# Patient Record
Sex: Male | Born: 1975 | Race: White | Hispanic: No | Marital: Single | Smoking: Never smoker
Health system: Southern US, Community
[De-identification: ages and names within clinical notes are randomized; demographics above are authoritative.]

## PROBLEM LIST (undated history)

## (undated) DIAGNOSIS — T7840XA Allergy, unspecified, initial encounter: Secondary | ICD-10-CM

## (undated) HISTORY — DX: Allergy, unspecified, initial encounter: T78.40XA

## (undated) HISTORY — PX: EYE SURGERY: SHX253

---

## 2012-04-19 ENCOUNTER — Ambulatory Visit: Payer: 59

## 2012-04-19 ENCOUNTER — Ambulatory Visit: Payer: 59 | Admitting: Family Medicine

## 2012-04-19 VITALS — BP 137/80 | HR 88 | Temp 98.2°F | Resp 18 | Ht 68.5 in | Wt 188.0 lb

## 2012-04-19 DIAGNOSIS — M25476 Effusion, unspecified foot: Secondary | ICD-10-CM

## 2012-04-19 DIAGNOSIS — M25572 Pain in left ankle and joints of left foot: Secondary | ICD-10-CM

## 2012-04-19 DIAGNOSIS — M25473 Effusion, unspecified ankle: Secondary | ICD-10-CM

## 2012-04-19 DIAGNOSIS — M25579 Pain in unspecified ankle and joints of unspecified foot: Secondary | ICD-10-CM

## 2012-04-19 MED ORDER — IBUPROFEN 800 MG PO TABS: 800.0000 mg | ORAL_TABLET | Freq: Three times a day (TID) | ORAL | Status: DC | PRN

## 2012-04-19 MED ORDER — TRAMADOL HCL 50 MG PO TABS: 50.0000 mg | ORAL_TABLET | Freq: Three times a day (TID) | ORAL | Status: DC | PRN

## 2012-04-19 NOTE — Progress Notes (Signed)
[facility]:  Office Visit  Chief Complaint:  Chief Complaint  Patient presents with  . Ankle Pain    since Monday night-slight swelling    HPI: Peter Love is a 36 y.o. male who complains of  Acute left ankle pain x 3 days, swelling, 4/10 sharp constant pain, 9/10 worse pain with weight bearing. Tried ibuprofen and ice without relief. NKI. No h/o gout. Has had a ankle fracture but does not know which one. No fevers, chills. No recent illnesses, no new footwear. He is a runner and over Thanksgiving instead of 3-4 miles he did 5 miles of running on treadmill. Pain is mostly in back of ankle at achilles tendon insertion and on inside area of ankle joint., + pain, swelling, slight warmth.  Past Medical History  Diagnosis Date  . Allergy    Past Surgical History  Procedure Date  . Eye surgery    History   Social History  . Marital Status: Single    Spouse Name: N/A    Number of Children: N/A  . Years of Education: N/A   Social History Main Topics  . Smoking status: Never Smoker   . Smokeless tobacco: None  . Alcohol Use: Yes  . Drug Use: No  . Sexually Active: Yes   Other Topics Concern  . None   Social History Narrative  . None   History reviewed. No pertinent family history. No Known Allergies Prior to Admission medications   Not on File     ROS: The patient denies fevers, chills, night sweats, unintentional weight loss, chest pain, palpitations, wheezing, dyspnea on exertion, nausea, vomiting, abdominal pain, dysuria, hematuria, melena, numbness, weakness, or tingling.   All other systems have been reviewed and were otherwise negative with the exception of those mentioned in the HPI and as above.    PHYSICAL EXAM: Filed Vitals:   04/19/12 0833  BP: 137/80  Pulse: 88  Temp: 98.2 F (36.8 C)  Resp: 18   Filed Vitals:   04/19/12 0833  Height: 5' 8.5" (1.74 m)  Weight: 188 lb (85.276 kg)   Body mass index is 28.17  kg/(m^2).  General: Alert, no acute distress HEENT:  Normocephalic, atraumatic, oropharynx patent.  Skin: No rashes. Cardiac-no pitting edema Neurologic: Facial musculature symmetric. Psychiatric: Patient is appropriate throughout our interaction. Musculoskeletal: Gait antalgic.  Left gastroc-nl Left achilles at area of insertion + tender on squeeze test, neg Thomas test Left ankle and foot- + medial malleolus swelling, + tenderness+ pain with dorsi and planta flexion, pain with dorsi flexion, flat foot, pain at achilles tendon, 5/5 strength, sensation intact, decrease inversion ROM due to pain   LABS: No results found for this or any previous visit.   EKG/XRAY:   Primary read interpreted by Dr. Conley Rolls at [facility]. ? stress fracture on talus vs shadow, normal lucency ? Hypodense sclerotic area on medial aspect of ankle    ASSESSMENT/PLAN: Encounter Diagnoses  Name Primary?  Marland Kitchen Ankle pain, left Yes  . 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 Stress fracture vs achilles tendonitis vs reactive arthritisvs  unlikely achilles tendon or gastrocnemius tear There is some sclerotic bony areas on the patient's medial malleolus and a normal variant ossicle off talus which mayor may not be of any significance.  Rx Cam walker Rx Ibuprofen and Tramadol F/u in 2 weeks.  If worsening sxs return sooner. Patient given precautions.    Hamilton Capri PHUONG, DO 04/19/2012 9:53  AM     

## 2012-04-21 ENCOUNTER — Ambulatory Visit (INDEPENDENT_AMBULATORY_CARE_PROVIDER_SITE_OTHER): Payer: 59 | Admitting: Internal Medicine

## 2012-04-21 VITALS — BP 143/83 | HR 76 | Temp 98.5°F | Resp 17 | Ht 68.5 in | Wt 187.0 lb

## 2012-04-21 DIAGNOSIS — M25572 Pain in left ankle and joints of left foot: Secondary | ICD-10-CM

## 2012-04-21 DIAGNOSIS — M109 Gout, unspecified: Secondary | ICD-10-CM

## 2012-04-21 DIAGNOSIS — M25579 Pain in unspecified ankle and joints of unspecified foot: Secondary | ICD-10-CM

## 2012-04-21 LAB — POCT CBC
Granulocyte percent: 59.4 %G (ref 37–80)
Hemoglobin: 16.7 g/dL (ref 14.1–18.1)
MCH, POC: 30.7 pg (ref 27–31.2)
MCV: 96.6 fL (ref 80–97)
MID (cbc): 0.6 (ref 0–0.9)
Platelet Count, POC: 284 10*3/uL (ref 142–424)
RBC: 5.44 M/uL (ref 4.69–6.13)
WBC: 8.2 10*3/uL (ref 4.6–10.2)

## 2012-04-21 LAB — URIC ACID: Uric Acid, Serum: 6.8 mg/dL (ref 4.0–7.8)

## 2012-04-21 MED ORDER — INDOMETHACIN 50 MG PO CAPS: ORAL_CAPSULE | ORAL | Status: DC

## 2012-04-21 NOTE — Patient Instructions (Signed)
Gout Gout is an inflammatory condition (arthritis) caused by a buildup of uric acid crystals in the joints. Uric acid is a chemical that is normally present in the blood. Under some circumstances, uric acid can form into crystals in your joints. This causes joint redness, soreness, and swelling (inflammation). Repeat attacks are common. Over time, uric acid crystals can form into masses (tophi) near a joint, causing disfigurement. Gout is treatable and often preventable. CAUSES  The disease begins with elevated levels of uric acid in the blood. Uric acid is produced by your body when it breaks down a naturally found substance called purines. This also happens when you eat certain foods such as meats and fish. Causes of an elevated uric acid level include:  Being passed down from parent to child (heredity).  Diseases that cause increased uric acid production (obesity, psoriasis, some cancers).  Excessive alcohol use.  Diet, especially diets rich in meat and seafood.  Medicines, including certain cancer-fighting drugs (chemotherapy), diuretics, and aspirin.  Chronic kidney disease. The kidneys are no longer able to remove uric acid well.  Problems with metabolism. Conditions strongly associated with gout include:  Obesity.  High blood pressure.  High cholesterol.  Diabetes. Not everyone with elevated uric acid levels gets gout. It is not understood why some people get gout and others do not. Surgery, joint injury, and eating too much of certain foods are some of the factors that can lead to gout. SYMPTOMS   An attack of gout comes on quickly. It causes intense pain with redness, swelling, and warmth in a joint.  Fever can occur.  Often, only one joint is involved. Certain joints are more commonly involved:  Base of the big toe.  Knee.  Ankle.  Wrist.  Finger. Without treatment, an attack usually goes away in a few days to weeks. Between attacks, you usually will not have  symptoms, which is different from many other forms of arthritis. DIAGNOSIS  Your caregiver will suspect gout based on your symptoms and exam. Removal of fluid from the joint (arthrocentesis) is done to check for uric acid crystals. Your caregiver will give you a medicine that numbs the area (local anesthetic) and use a needle to remove joint fluid for exam. Gout is confirmed when uric acid crystals are seen in joint fluid, using a special microscope. Sometimes, blood, urine, and X-ray tests are also used. TREATMENT  There are 2 phases to gout treatment: treating the sudden onset (acute) attack and preventing attacks (prophylaxis). Treatment of an Acute Attack  Medicines are used. These include anti-inflammatory medicines or steroid medicines.  An injection of steroid medicine into the affected joint is sometimes necessary.  The painful joint is rested. Movement can worsen the arthritis.  You may use warm or cold treatments on painful joints, depending which works best for you.  Discuss the use of coffee, vitamin C, or cherries with your caregiver. These may be helpful treatment options. Treatment to Prevent Attacks After the acute attack subsides, your caregiver may advise prophylactic medicine. These medicines either help your kidneys eliminate uric acid from your body or decrease your uric acid production. You may need to stay on these medicines for a very long time. The early phase of treatment with prophylactic medicine can be associated with an increase in acute gout attacks. For this reason, during the first few months of treatment, your caregiver may also advise you to take medicines usually used for acute gout treatment. Be sure you understand your caregiver's directions.   You should also discuss dietary treatment with your caregiver. Certain foods such as meats and fish can increase uric acid levels. Other foods such as dairy can decrease levels. Your caregiver can give you a list of foods  to avoid. HOME CARE INSTRUCTIONS   Do not take aspirin to relieve pain. This raises uric acid levels.  Only take over-the-counter or prescription medicines for pain, discomfort, or fever as directed by your caregiver.  Rest the joint as much as possible. When in bed, keep sheets and blankets off painful areas.  Keep the affected joint raised (elevated).  Use crutches if the painful joint is in your leg.  Drink enough water and fluids to keep your urine clear or pale yellow. This helps your body get rid of uric acid. Do not drink alcoholic beverages. They slow the passage of uric acid.  Follow your caregiver's dietary instructions. Pay careful attention to the amount of protein you eat. Your daily diet should emphasize fruits, vegetables, whole grains, and fat-free or low-fat milk products.  Maintain a healthy body weight. SEEK MEDICAL CARE IF:   You have an oral temperature above 102 F (38.9 C).  You develop diarrhea, vomiting, or any side effects from medicines.  You do not feel better in 24 hours, or you are getting worse. SEEK IMMEDIATE MEDICAL CARE IF:   Your joint becomes suddenly more tender and you have:  Chills.  An oral temperature above 102 F (38.9 C), not controlled by medicine. MAKE SURE YOU:   Understand these instructions.  Will watch your condition.  Will get help right away if you are not doing well or get worse. Document Released: 04/30/2000 Document Revised: 07/26/2011 Document Reviewed: 08/11/2009 Exit[employer] Patient Information 2013 [employer].    

## 2012-04-21 NOTE — Progress Notes (Signed)
  Subjective:    Patient ID: Peter Love, male    DOB: September 13, 1975, 36 y.o.   MRN: [id number]  HPI 2 days ago seen and txed by Dr. Conley Rolls, note reviewed in detail. Ankle now more swollen, red, warm at medial malleolus, Camwalker and motrin 800mg  helps a lot but still progressed. XR read as normal by radiology. No hx or fhx of gout, altho looks and acts like gout. No reason for infection, no wounds,trauma, fever, and does not feel sick.   Review of Systems     Objective:   Physical Exam  Constitutional: He is oriented to person, place, and time. He appears well-developed and well-nourished.  Musculoskeletal: He exhibits edema and tenderness.       Right foot: He exhibits tenderness, bony tenderness and swelling.       Feet:       Red, warm, tender Ankle rom is preserved/intact  Neurological: He is alert and oriented to person, place, and time. He exhibits normal muscle tone. Coordination normal.  Skin: Rash noted. There is erythema.  Psychiatric: He has a normal mood and affect.   Results for orders placed in visit on 04/21/12  POCT CBC      Component Value Range   WBC 8.2  4.6 - 10.2 K/uL   Lymph, poc 2.7  0.6 - 3.4   POC LYMPH PERCENT 33.0  10 - 50 %L   MID (cbc) 0.6  0 - 0.9   POC MID % 7.6  0 - 12 %M   POC Granulocyte 4.9  2 - 6.9   Granulocyte percent 59.4  37 - 80 %G   RBC 5.44  4.69 - 6.13 M/uL   Hemoglobin 16.7  14.1 - 18.1 g/dL   HCT, POC 78.2  95.6 - 53.7 %   MCV 96.6  80 - 97 fL   MCH, POC 30.7  27 - 31.2 pg   MCHC 31.7 (*) 31.8 - 35.4 g/dL   RDW, POC 21.3     Platelet Count, POC 284  142 - 424 K/uL   MPV 8.8  0 - 99.8 fL    Sed rate/ uric acid pending.      Assessment & Plan:  Probable gout Indocin 50mg  QID till improved,then tid Reck Sunday 12 noon, sooner if worse

## 2012-11-27 ENCOUNTER — Ambulatory Visit: Payer: 59 | Admitting: Physician Assistant

## 2012-11-27 DIAGNOSIS — T6391XA Toxic effect of contact with unspecified venomous animal, accidental (unintentional), initial encounter: Secondary | ICD-10-CM

## 2012-11-27 DIAGNOSIS — T63461A Toxic effect of venom of wasps, accidental (unintentional), initial encounter: Secondary | ICD-10-CM

## 2012-11-27 MED ORDER — RANITIDINE HCL 300 MG PO TABS: 300.0000 mg | ORAL_TABLET | Freq: Every day | ORAL | Status: AC

## 2012-11-27 MED ORDER — CETIRIZINE HCL 10 MG PO TABS: 10.0000 mg | ORAL_TABLET | Freq: Every day | ORAL | Status: AC

## 2012-11-27 NOTE — Progress Notes (Signed)
   [address], [place] [address]   Phone [phone]  Subjective:    Patient ID: Peter Love, male    DOB: 1976-03-31, 37 y.o.   MRN: [id number]  HPI Pt presents to clinic with area on his L abd that is red and itchy.  He was golfing and felt something sting him.  He had a red bump last pm but then this am the redness had spread and he wants to make sure he is ok.  He has used nothing to the area.   Review of Systems  Constitutional: Negative for fever and chills.  Skin: Positive for rash.       Objective:   Physical Exam  Vitals reviewed. Constitutional: He is oriented to person, place, and time. He appears well-developed and well-nourished.  HENT:  Head: Normocephalic and atraumatic.  Right Ear: External ear normal.  Left Ear: External ear normal.  Eyes: Conjunctivae are normal.  Neck: Normal range of motion.  Pulmonary/Chest: Effort normal.  Neurological: He is alert and oriented to person, place, and time.  Skin: Skin is warm and dry. There is erythema.  6x4in area - marked for reference - central erythema with induration and surrounding blanching. The area is warm to touch.  The sting area is visible.    Psychiatric: He has a normal mood and affect. His behavior is normal. Judgment and thought content normal.          Assessment & Plan:  Bee sting reaction, initial encounter -due to length of time and the fact that pt felt a sting we will treat as an allergic reaction but if patient notices any increase in erythema he will RTC for evaluation of infection - Plan: cetirizine (ZYRTEC) 10 MG tablet, ranitidine (ZANTAC) 300 MG tablet Pt to use ice or cool compresses on the area.  Benny Lennert PA-C 11/27/2012 8:57 AM

## 2013-12-26 IMAGING — CR DG ANKLE COMPLETE 3+V*L*
2 series · 2 of 2 positions shown · non-contrast
Comparison: None.

CLINICAL DATA: Acute ankle pain with weightbearing.  No known
recent trauma.  Swelling.

LEFT ANKLE COMPLETE - 3+ VIEW

[AP]
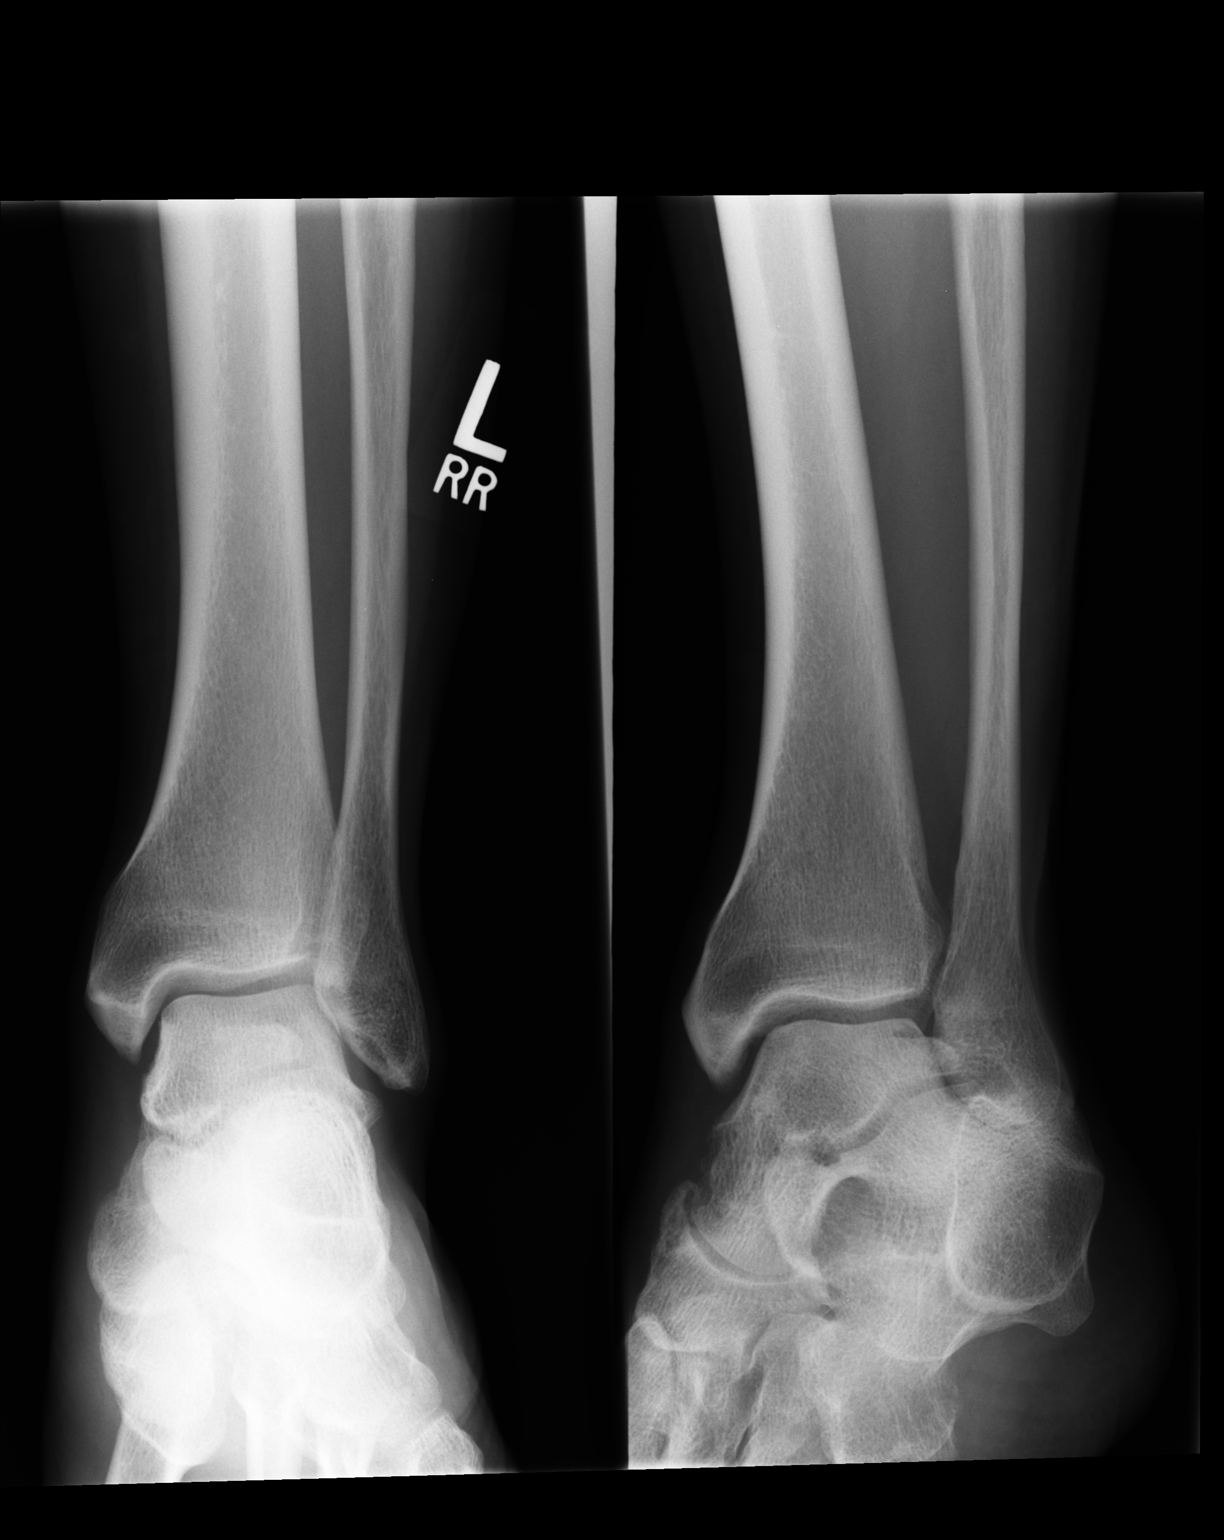

[lateral]
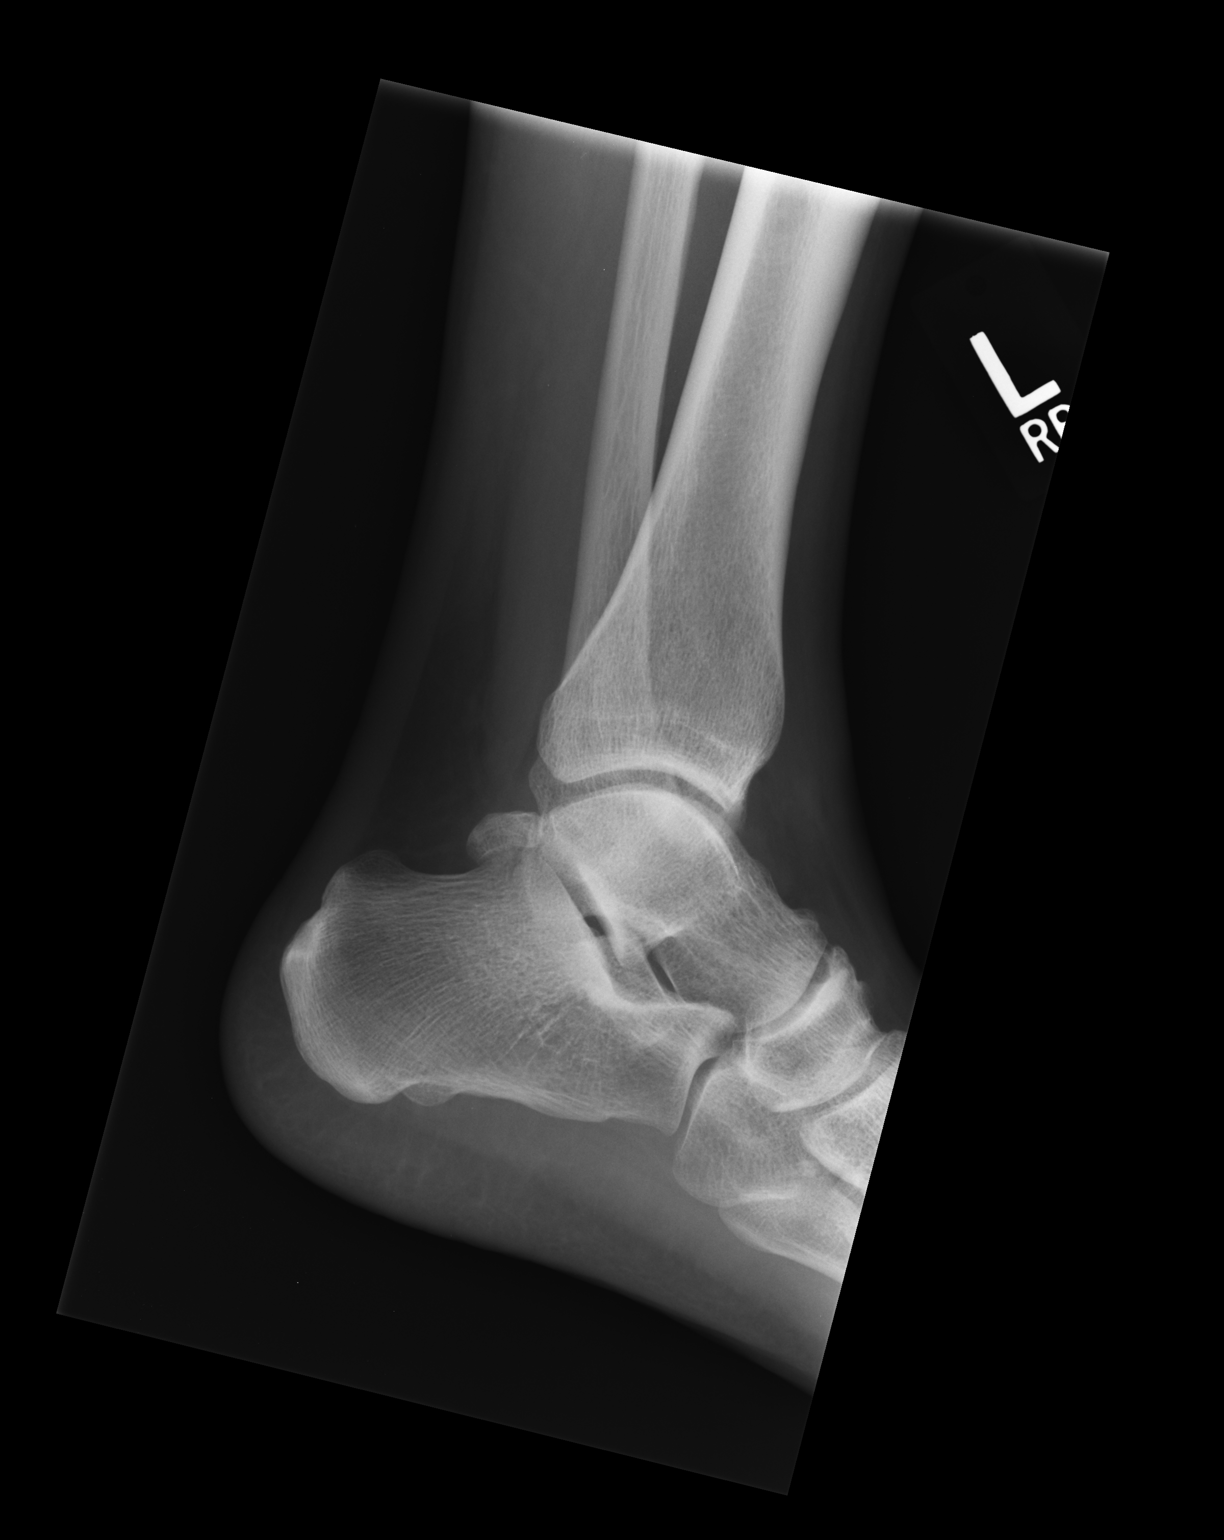

[2 of 2 positions shown; findings below may reference images not displayed]

FINDINGS: Ankle is located.  No acute fracture is identified.  No
evidence of joint effusion.  It is noted that the patient has an os
trigonum, an accessory ossicle.
IMPRESSION: 1.  No acute bony abnormality identified.
2.  Os trigonum present.  Although typically an incidental finding,
pain associated with the os trigonum (os trigonum syndrome) has
been described.

Clinically significant discrepancy from primary report, if
provided: None

## 2014-05-01 ENCOUNTER — Encounter (HOSPITAL_COMMUNITY): Payer: Self-pay | Admitting: *Deleted

## 2014-05-01 ENCOUNTER — Emergency Department (HOSPITAL_COMMUNITY)
Admission: EM | Admit: 2014-05-01 | Discharge: 2014-05-01 | Disposition: A | Discharge status: Home / Self Care | Payer: 59 | Source: Home / Self Care | Attending: Emergency Medicine | Admitting: Emergency Medicine

## 2014-05-01 DIAGNOSIS — M5431 Sciatica, right side: Secondary | ICD-10-CM

## 2014-05-01 MED ORDER — IBUPROFEN 800 MG PO TABS: 800.0000 mg | ORAL_TABLET | Freq: Three times a day (TID) | ORAL | Status: AC

## 2014-05-01 MED ORDER — METHOCARBAMOL 500 MG PO TABS: 500.0000 mg | ORAL_TABLET | Freq: Two times a day (BID) | ORAL | Status: AC

## 2014-05-01 NOTE — ED Provider Notes (Signed)
CSN: [id number]     Arrival date & time 05/01/14  1015 History   First MD Initiated Contact with Patient 05/01/14 1104     Chief Complaint  Patient presents with  . Leg Pain   (Consider location/radiation/quality/duration/timing/severity/associated sxs/prior Treatment) HPI Peter Love is a 38 y.o. male [facility] pain that started 2 days ago. He reports that he thinks he injured his leg getting out of bed. He got up quickly to get ready for work. The pain starts in the left buttock and radiates down his leg. He wanted to get it checked out today before he goes on a trip for work. He denies any other problems.   Past Medical History  Diagnosis Date  . Allergy    Past Surgical History  Procedure Laterality Date  . Eye surgery     History reviewed. No pertinent family history. History  Substance Use Topics  . Smoking status: Never Smoker   . Smokeless tobacco: Not on file  . Alcohol Use: Yes    Review of Systems Negative except as stated in HPI  Allergies  Review of patient's allergies indicates no known allergies.  Home Medications   Prior to Admission medications   Medication Sig Start Date End Date Taking? Authorizing Provider  cetirizine (ZYRTEC) 10 MG tablet Take 1 tablet (10 mg total) by mouth daily. 11/27/12   Morrell RiddleSarah L Weber, PA-C  ibuprofen (ADVIL,MOTRIN) 800 MG tablet Take 1 tablet (800 mg total) by mouth 3 (three) times daily. 05/01/14   Hope Orlene OchM Neese, NP  methocarbamol (ROBAXIN) 500 MG tablet Take 1 tablet (500 mg total) by mouth 2 (two) times daily. 05/01/14   Hope Orlene OchM Neese, NP  ranitidine (ZANTAC) 300 MG tablet Take 1 tablet (300 mg total) by mouth at bedtime. 11/27/12   Morrell RiddleSarah L Weber, PA-C   BP 128/74 mmHg  Pulse 72  Temp(Src) 98.6 F (37 C) (Oral)  Resp 16  SpO2 100% Physical Exam  Constitutional: He is oriented to person, place, and time. He appears well-developed and well-nourished.  HENT:  Head: Normocephalic.  Eyes: EOM are  normal.  Neck: Neck supple.  Cardiovascular: Normal rate.   Pulmonary/Chest: Effort normal.  Abdominal: Soft. There is no tenderness.  Musculoskeletal: Normal range of motion.       Left hip: He exhibits normal range of motion, normal strength, no swelling and no deformity.  Full range of motion of right ankle, knee and hip without pain while patient in lying position. No pain with palpation. Pedal pulses equal, adequate circulation, good touch sensation. When patient stands and walks he feels a slight pain in the right buttock and there is mild pain with palpation over the right sciatic nerve.   Neurological: He is alert and oriented to person, place, and time. He has normal strength. No cranial nerve deficit or sensory deficit. Gait normal.  Patient ambulatory without difficulty. States the pain comes with ambulation.   Skin: Skin is warm and dry.  Psychiatric: He has a normal mood and affect. His behavior is normal.  Nursing note and vitals reviewed.   ED Course  Procedures (including critical care time) Labs Review MDM  38 y.o. male with pain to the right buttock that radiates to the posterior aspect of the right leg. Stable for discharge without neuro deficits. Will teat for sciatica with muscle relaxants and ibuprofen. He will return as needed for worsening symptoms. Discussed with the patient and all questioned fully answered.  Medication List    TAKE these medications        ibuprofen 800 MG tablet  Commonly known as:  ADVIL,MOTRIN  Take 1 tablet (800 mg total) by mouth 3 (three) times daily.     methocarbamol 500 MG tablet  Commonly known as:  ROBAXIN  Take 1 tablet (500 mg total) by mouth 2 (two) times daily.      ASK your doctor about these medications        cetirizine 10 MG tablet  Commonly known as:  ZYRTEC  Take 1 tablet (10 mg total) by mouth daily.     ranitidine 300 MG tablet  Commonly known as:  ZANTAC  Take 1 tablet (300 mg total) by mouth at bedtime.           1. Sciatica, right        Janne NapoleonHope M Neese, NP 05/01/14 1124

## 2014-05-01 NOTE — ED Notes (Signed)
PT   REPORTS  L  LEG  PAIN         X  2  DAYS    HE  REPORTS  HE  INJURED  HIS  LEG  WHEN  HE  GOT  OUT  OF  BED     HE  REPORTS  PAIN  FROM  THE  GLUTEOUS   DOWN  THE  L  LEFT  LEG    TO  HIS  TOES       WITH  SOME  NUMBNESS  AND  TINGLING           HE  AMBULATED  TO  ROOM  WITH A  STEADY  FLUID  GAIT            HE  DENYS  ANY  BACK  PAIN
